# Patient Record
Sex: Male | Born: 1990 | Race: White | Hispanic: No | Marital: Married | Smoking: Former smoker
Health system: Southern US, Community
[De-identification: ages and names within clinical notes are randomized; demographics above are authoritative.]

## PROBLEM LIST (undated history)

## (undated) DIAGNOSIS — K219 Gastro-esophageal reflux disease without esophagitis: Secondary | ICD-10-CM

## (undated) DIAGNOSIS — F32A Depression, unspecified: Secondary | ICD-10-CM

## (undated) DIAGNOSIS — F419 Anxiety disorder, unspecified: Secondary | ICD-10-CM

## (undated) HISTORY — PX: NASAL POLYP SURGERY: SHX186

---
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
## 2018-04-06 ENCOUNTER — Other Ambulatory Visit: Payer: Self-pay

## 2018-04-06 DIAGNOSIS — Y999 Unspecified external cause status: Secondary | ICD-10-CM | POA: Insufficient documentation

## 2018-04-06 DIAGNOSIS — Y93H2 Activity, gardening and landscaping: Secondary | ICD-10-CM | POA: Insufficient documentation

## 2018-04-06 DIAGNOSIS — X500XXA Overexertion from strenuous movement or load, initial encounter: Secondary | ICD-10-CM | POA: Insufficient documentation

## 2018-04-06 DIAGNOSIS — Y929 Unspecified place or not applicable: Secondary | ICD-10-CM | POA: Insufficient documentation

## 2018-04-06 DIAGNOSIS — S76911A Strain of unspecified muscles, fascia and tendons at thigh level, right thigh, initial encounter: Secondary | ICD-10-CM | POA: Diagnosis not present

## 2018-04-06 DIAGNOSIS — S79921A Unspecified injury of right thigh, initial encounter: Secondary | ICD-10-CM | POA: Diagnosis present

## 2018-04-06 DIAGNOSIS — Z87891 Personal history of nicotine dependence: Secondary | ICD-10-CM | POA: Diagnosis not present

## 2018-04-06 NOTE — ED Triage Notes (Addendum)
Pt arrives to ED via POV with c/o RIGHT thigh, leg, and testicle pain x12 hrs. Pt reports s/x's started while trying to pull-start a weed trimmer PTA and questions "pulling a muscle". Pt reports going to a walk-in clinic today to be seen, and states "they just wanted to do x-rays and prescribe streroids, and I can't afford to pay out of pocket". CMS intact in affected extremity. No obvious injury, trauma, or dislocation.

## 2018-04-07 ENCOUNTER — Emergency Department
Admission: EM | Admit: 2018-04-07 | Discharge: 2018-04-07 | Disposition: A | Discharge status: Home / Self Care | Payer: BLUE CROSS/BLUE SHIELD | Attending: Emergency Medicine | Admitting: Emergency Medicine

## 2018-04-07 DIAGNOSIS — T148XXA Other injury of unspecified body region, initial encounter: Secondary | ICD-10-CM

## 2018-04-07 MED ORDER — HYDROCODONE-ACETAMINOPHEN 5-325 MG PO TABS: 1.0000 | ORAL_TABLET | Freq: Four times a day (QID) | ORAL | 0 refills | Status: DC | PRN

## 2018-04-07 MED ORDER — IBUPROFEN 600 MG PO TABS: 600.0000 mg | ORAL_TABLET | Freq: Three times a day (TID) | ORAL | 0 refills | Status: DC | PRN

## 2018-04-07 NOTE — ED Provider Notes (Signed)
[facility] Emergency Department Provider Note  ____________________________________________   First MD Initiated Contact with Patient 04/07/18 0209     (approximate)  I have reviewed the triage vital signs and the nursing notes.   HISTORY  Chief Complaint Leg Pain   HPI Eddie Stout is a 27 y.o. male who self presents to the emergency department with sudden onset severe right thigh and right leg pain that began about 12 hours ago.  It started while he was trying to start a weed trimmer to clean his backyard.  He also has throbbing aching to the right lateral aspect of his testicle.  The pain was roughly constant for about 10 hours although since being in the waiting room his spontaneously resolved.  No nausea or vomiting.  No numbness or weakness.  No bowel or bladder incontinence or hesitance.  No history of IV drug use.  Normal perianal sensation.  No testicular pain at this point.  He went to an urgent care however saw how much she was going to cost to be evaluated and he came here instead.  He currently has no complaints.  His symptoms came on suddenly and passed away quickly on their own.  At the time that they occurred they were worse with walking and improved with rest.  History reviewed. No pertinent past medical history.  There are no active problems to display for this patient.   Past Surgical History:  Procedure Laterality Date  . NASAL POLYP SURGERY      Prior to Admission medications   Medication Sig Start Date End Date Taking? Authorizing Provider  HYDROcodone-acetaminophen (NORCO) 5-325 MG tablet Take 1 tablet by mouth every 6 (six) hours as needed for up to 7 doses for severe pain. 04/07/18   Merrily Brittle, MD  ibuprofen (ADVIL,MOTRIN) 600 MG tablet Take 1 tablet (600 mg total) by mouth every 8 (eight) hours as needed. 04/07/18   Merrily Brittle, MD    Allergies Patient has no known allergies.  No family history on file.  Social  History Social History   Tobacco Use  . Smoking status: Former Games developer  . Smokeless tobacco: Never Used  Substance Use Topics  . Alcohol use: Not on file  . Drug use: Not on file    Review of Systems Constitutional: No fever/chills Eyes: No visual changes. ENT: No sore throat. Cardiovascular: Denies chest pain. Respiratory: Denies shortness of breath. Gastrointestinal: No abdominal pain.  No nausea, no vomiting.  No diarrhea.  No constipation. Genitourinary: Negative for dysuria. Musculoskeletal: Positive for back pain. Skin: Negative for rash. Neurological: Negative for headaches, focal weakness or numbness.   ____________________________________________   PHYSICAL EXAM:  VITAL SIGNS: ED Triage Vitals  Enc Vitals Group     BP 04/06/18 2156 (!) 144/76     Pulse Rate 04/06/18 2156 85     Resp 04/06/18 2156 20     Temp 04/06/18 2156 98.6 F (37 C)     Temp Source 04/06/18 2156 Oral     SpO2 04/06/18 2156 98 %     Weight 04/06/18 2158 (!) 413 lb (187.3 kg)     Height 04/06/18 2158  (1.905 m)     Head Circumference --      Peak Flow --      Pain Score 04/06/18 2205 10     Pain Loc --      Pain Edu? --      Excl. in GC? --  Constitutional: Alert and oriented x4 well-appearing nontoxic no diaphoresis speaks full clear sentences Eyes: PERRL EOMI. Head: Atraumatic. Nose: No congestion/rhinnorhea. Mouth/Throat: No trismus Neck: No stridor.   Cardiovascular: Normal rate, regular rhythm. Grossly normal heart sounds.  Good peripheral circulation. Respiratory: Normal respiratory effort.  No retractions. Lungs CTAB and moving good air Gastrointestinal: Soft nontender Musculoskeletal: No lower extremity edema   No midline back tenderness 5 out of 5 hip flexion extension plantar flexion dorsiflexion 2+ DTRs no ankle clonus Neurologic:  Normal speech and language. No gross focal neurologic deficits are appreciated. Skin:  Skin is warm, dry and intact. No rash  noted. Psychiatric: Mood and affect are normal. Speech and behavior are normal.    ____________________________________________   DIFFERENTIAL includes but not limited to  Radicular low back pain, cauda equina, epidural abscess, testicular torsion ____________________________________________   LABS (all labs ordered are listed, but only abnormal results are displayed)  Labs Reviewed - No data to display   __________________________________________  EKG   ____________________________________________  RADIOLOGY   ____________________________________________   PROCEDURES  Procedure(s) performed: no  Procedures  Critical Care performed: no  Observation: no ____________________________________________   INITIAL IMPRESSION / ASSESSMENT AND PLAN / ED COURSE  Pertinent labs & imaging results that were available during my care of the patient were reviewed by me and considered in my medical decision making (see chart for details).       ----------------------------------------- 2:29 AM on 04/07/2018 -----------------------------------------  While in the waiting room the patient's pain resolved.  He is asymptomatic.  He is neuro intact.  He has no red flags.  He is asking primarily for a work note.  Doubt testicular torsion at this time.  I do lengthy discussion with the patient regarding the diagnostic uncertainty and the importance of strict return precautions.  He verbalizes understanding and agreement with the plan.  We will give a short course of pain medication for home in case his pain should recur.  Discharged home in improved condition he verbalizes understanding agree with the plan. ____________________________________________   FINAL CLINICAL IMPRESSION(S) / ED DIAGNOSES  Final diagnoses:  Muscle strain      NEW MEDICATIONS STARTED DURING THIS VISIT:  Discharge Medication List as of 04/07/2018  2:29 AM    START taking these medications    Details  HYDROcodone-acetaminophen (NORCO) 5-325 MG tablet Take 1 tablet by mouth every 6 (six) hours as needed for up to 7 doses for severe pain., Starting Thu 04/07/2018, Print    ibuprofen (ADVIL,MOTRIN) 600 MG tablet Take 1 tablet (600 mg total) by mouth every 8 (eight) hours as needed., Starting Thu 04/07/2018, Print         Note:  This document was prepared using Dragon voice recognition software and may include unintentional dictation errors.     Merrily Brittle, MD 04/09/18 1057
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
## 2018-04-07 NOTE — Discharge Instructions (Signed)
It was a pleasure to take care of you today, and thank you for coming to our emergency department.  If you have any questions or concerns before leaving please ask the nurse to grab me and I'm more than happy to go through your aftercare instructions again.  If you were prescribed any opioid pain medication today such as Norco, Vicodin, Percocet, morphine, hydrocodone, or oxycodone please make sure you do not drive when you are taking this medication as it can alter your ability to drive safely.  If you have any concerns once you are home that you are not improving or are in fact getting worse before you can make it to your follow-up appointment, please do not hesitate to call 911 and come back for further evaluation.  Merrily Brittle, MD  Unfortunately prescriptions medications can be very expensive.  Please consider [facility] as they have a number of medications that are $4 for a 30 day supply.  [email or web address].[email or web address]  Another great option is www.goodrx.com which can help you find the most affordable prices around you.

## 2020-08-02 ENCOUNTER — Other Ambulatory Visit: Payer: Self-pay | Admitting: Physician Assistant

## 2020-08-02 ENCOUNTER — Ambulatory Visit
Admission: RE | Admit: 2020-08-02 | Discharge: 2020-08-02 | Disposition: A | Discharge status: Home / Self Care | Payer: 59 | Attending: Physician Assistant | Admitting: Physician Assistant

## 2020-08-02 ENCOUNTER — Ambulatory Visit
Admission: RE | Admit: 2020-08-02 | Discharge: 2020-08-02 | Disposition: A | Discharge status: Home / Self Care | Payer: 59 | Source: Ambulatory Visit | Attending: Physician Assistant | Admitting: Physician Assistant

## 2020-08-02 DIAGNOSIS — M545 Low back pain, unspecified: Secondary | ICD-10-CM

## 2020-08-02 DIAGNOSIS — M549 Dorsalgia, unspecified: Secondary | ICD-10-CM | POA: Insufficient documentation

## 2020-08-02 DIAGNOSIS — G8929 Other chronic pain: Secondary | ICD-10-CM | POA: Diagnosis present

## 2020-09-27 ENCOUNTER — Emergency Department: Payer: 59

## 2020-09-27 ENCOUNTER — Encounter: Payer: Self-pay | Admitting: Emergency Medicine

## 2020-09-27 ENCOUNTER — Emergency Department
Admission: EM | Admit: 2020-09-27 | Discharge: 2020-09-27 | Disposition: A | Discharge status: Home / Self Care | Payer: 59 | Attending: Emergency Medicine | Admitting: Emergency Medicine

## 2020-09-27 ENCOUNTER — Other Ambulatory Visit: Payer: Self-pay

## 2020-09-27 DIAGNOSIS — S0990XA Unspecified injury of head, initial encounter: Secondary | ICD-10-CM | POA: Diagnosis present

## 2020-09-27 DIAGNOSIS — Z87891 Personal history of nicotine dependence: Secondary | ICD-10-CM | POA: Diagnosis not present

## 2020-09-27 DIAGNOSIS — Y9389 Activity, other specified: Secondary | ICD-10-CM | POA: Diagnosis not present

## 2020-09-27 DIAGNOSIS — Y92410 Unspecified street and highway as the place of occurrence of the external cause: Secondary | ICD-10-CM | POA: Insufficient documentation

## 2020-09-27 DIAGNOSIS — S301XXA Contusion of abdominal wall, initial encounter: Secondary | ICD-10-CM | POA: Diagnosis not present

## 2020-09-27 DIAGNOSIS — S161XXA Strain of muscle, fascia and tendon at neck level, initial encounter: Secondary | ICD-10-CM | POA: Insufficient documentation

## 2020-09-27 DIAGNOSIS — R109 Unspecified abdominal pain: Secondary | ICD-10-CM

## 2020-09-27 DIAGNOSIS — R519 Headache, unspecified: Secondary | ICD-10-CM

## 2020-09-27 HISTORY — DX: Depression, unspecified: F32.A

## 2020-09-27 HISTORY — DX: Anxiety disorder, unspecified: F41.9

## 2020-09-27 HISTORY — DX: Gastro-esophageal reflux disease without esophagitis: K21.9

## 2020-09-27 LAB — COMPREHENSIVE METABOLIC PANEL
ALT: 28 U/L (ref 0–44)
AST: 23 U/L (ref 15–41)
Albumin: 4.2 g/dL (ref 3.5–5.0)
Alkaline Phosphatase: 81 U/L (ref 38–126)
Anion gap: 9 (ref 5–15)
BUN: 16 mg/dL (ref 6–20)
CO2: 27 mmol/L (ref 22–32)
Calcium: 8.8 mg/dL — ABNORMAL LOW (ref 8.9–10.3)
Chloride: 106 mmol/L (ref 98–111)
Creatinine, Ser: 1.18 mg/dL (ref 0.61–1.24)
GFR, Estimated: >60 mL/min (ref >60)
Glucose, Bld: 101 mg/dL — ABNORMAL HIGH (ref 70–99)
Potassium: 3.9 mmol/L (ref 3.5–5.1)
Sodium: 142 mmol/L (ref 135–145)
Total Bilirubin: 0.8 mg/dL (ref 0.3–1.2)
Total Protein: 7.7 g/dL (ref 6.5–8.1)

## 2020-09-27 LAB — CBC WITH DIFFERENTIAL/PLATELET
Abs Immature Granulocytes: 0.04 10*3/uL (ref 0.00–0.07)
Basophils Absolute: 0 10*3/uL (ref 0.0–0.1)
Basophils Relative: 0 %
Eosinophils Absolute: 0.1 10*3/uL (ref 0.0–0.5)
Eosinophils Relative: 1 %
HCT: 43.5 % (ref 39.0–52.0)
Hemoglobin: 14.4 g/dL (ref 13.0–17.0)
Immature Granulocytes: 0 %
Lymphocytes Relative: 19 %
Lymphs Abs: 2.8 10*3/uL (ref 0.7–4.0)
MCH: 27.9 pg (ref 26.0–34.0)
MCHC: 33.1 g/dL (ref 30.0–36.0)
MCV: 84.1 fL (ref 80.0–100.0)
Monocytes Absolute: 0.8 10*3/uL (ref 0.1–1.0)
Monocytes Relative: 5 %
Neutro Abs: 11 10*3/uL — ABNORMAL HIGH (ref 1.7–7.7)
Neutrophils Relative %: 75 %
Platelets: 286 10*3/uL (ref 150–400)
RBC: 5.17 MIL/uL (ref 4.22–5.81)
RDW: 13.7 % (ref 11.5–15.5)
WBC: 14.7 10*3/uL — ABNORMAL HIGH (ref 4.0–10.5)
nRBC: 0 % (ref 0.0–0.2)

## 2020-09-27 LAB — URINALYSIS, COMPLETE (UACMP) WITH MICROSCOPIC
Bacteria, UA: NONE SEEN
Bilirubin Urine: NEGATIVE
Glucose, UA: NEGATIVE mg/dL
Hgb urine dipstick: NEGATIVE
Ketones, ur: NEGATIVE mg/dL
Leukocytes,Ua: NEGATIVE
Nitrite: NEGATIVE
Protein, ur: NEGATIVE mg/dL
Specific Gravity, Urine: 1.028 (ref 1.005–1.030)
Squamous Epithelial / HPF: NONE SEEN (ref 0–5)
pH: 6 (ref 5.0–8.0)

## 2020-09-27 MED ORDER — IOHEXOL 300 MG/ML  SOLN
125.0000 mL | Freq: Once | INTRAMUSCULAR | Status: AC | PRN
  Administered 2020-09-27: 125 mL via INTRAVENOUS
  Filled 2020-09-27: qty 125

## 2020-09-27 MED ORDER — HYDROCODONE-ACETAMINOPHEN 5-325 MG PO TABS: 1.0000 | ORAL_TABLET | Freq: Four times a day (QID) | ORAL | 0 refills | Status: AC | PRN

## 2020-09-27 MED ORDER — ONDANSETRON HCL 4 MG/2ML IJ SOLN
4.0000 mg | Freq: Once | INTRAMUSCULAR | Status: AC
  Administered 2020-09-27: 4 mg via INTRAVENOUS
  Filled 2020-09-27: qty 2

## 2020-09-27 MED ORDER — MORPHINE SULFATE (PF) 4 MG/ML IV SOLN
4.0000 mg | Freq: Once | INTRAVENOUS | Status: AC
  Administered 2020-09-27: 4 mg via INTRAVENOUS
  Filled 2020-09-27: qty 1

## 2020-09-27 MED ORDER — CYCLOBENZAPRINE HCL 10 MG PO TABS: 10.0000 mg | ORAL_TABLET | Freq: Three times a day (TID) | ORAL | 0 refills | Status: AC | PRN

## 2020-09-27 NOTE — Discharge Instructions (Signed)
Follow-up with your regular doctor if not improving in 3 to 4 days.  Return to the emergency department if worsening. 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  You should have repeat imaging in approximately 6 months. Take your medication as prescribed
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
## 2020-10-09 ENCOUNTER — Encounter: Payer: Self-pay | Admitting: Emergency Medicine

## 2020-10-09 ENCOUNTER — Emergency Department
Admission: EM | Admit: 2020-10-09 | Discharge: 2020-10-09 | Disposition: A | Discharge status: Home / Self Care | Payer: 59 | Attending: Emergency Medicine | Admitting: Emergency Medicine

## 2020-10-09 ENCOUNTER — Other Ambulatory Visit: Payer: Self-pay

## 2020-10-09 DIAGNOSIS — Y92410 Unspecified street and highway as the place of occurrence of the external cause: Secondary | ICD-10-CM | POA: Insufficient documentation

## 2020-10-09 DIAGNOSIS — S39012A Strain of muscle, fascia and tendon of lower back, initial encounter: Secondary | ICD-10-CM | POA: Diagnosis not present

## 2020-10-09 DIAGNOSIS — Z87891 Personal history of nicotine dependence: Secondary | ICD-10-CM | POA: Insufficient documentation

## 2020-10-09 DIAGNOSIS — M5431 Sciatica, right side: Secondary | ICD-10-CM | POA: Insufficient documentation

## 2020-10-09 DIAGNOSIS — Y9389 Activity, other specified: Secondary | ICD-10-CM | POA: Diagnosis not present

## 2020-10-09 DIAGNOSIS — S3992XA Unspecified injury of lower back, initial encounter: Secondary | ICD-10-CM | POA: Diagnosis present

## 2020-10-09 MED ORDER — LIDOCAINE 5 % EX PTCH
1.0000 | MEDICATED_PATCH | CUTANEOUS | Status: DC
  Administered 2020-10-09: 1 via TRANSDERMAL
  Filled 2020-10-09: qty 1

## 2020-10-09 MED ORDER — PREDNISONE 10 MG (21) PO TBPK: ORAL_TABLET | ORAL | 0 refills | Status: AC

## 2020-10-09 MED ORDER — LIDOCAINE 5 % EX PTCH: 1.0000 | MEDICATED_PATCH | Freq: Two times a day (BID) | CUTANEOUS | 0 refills | Status: AC

## 2020-10-09 MED ORDER — PREDNISONE 20 MG PO TABS
60.0000 mg | ORAL_TABLET | Freq: Once | ORAL | Status: AC
  Administered 2020-10-09: 60 mg via ORAL
  Filled 2020-10-09: qty 3

## 2020-10-09 MED ORDER — KETOROLAC TROMETHAMINE 60 MG/2ML IM SOLN
60.0000 mg | Freq: Once | INTRAMUSCULAR | Status: AC
  Administered 2020-10-09: 60 mg via INTRAMUSCULAR
  Filled 2020-10-09: qty 2

## 2020-10-09 MED ORDER — KETOROLAC TROMETHAMINE 10 MG PO TABS: 10.0000 mg | ORAL_TABLET | Freq: Three times a day (TID) | ORAL | 0 refills | Status: AC | PRN

## 2020-10-09 NOTE — Discharge Instructions (Signed)
Please seek medical attention for any high fevers, chest pain, shortness of breath, change in behavior, persistent vomiting, bloody stool or any other new or concerning symptoms.  
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
## 2022-12-08 DIAGNOSIS — Z1159 Encounter for screening for other viral diseases: Secondary | ICD-10-CM | POA: Diagnosis not present

## 2022-12-08 DIAGNOSIS — Z1389 Encounter for screening for other disorder: Secondary | ICD-10-CM | POA: Diagnosis not present

## 2022-12-22 DIAGNOSIS — R69 Illness, unspecified: Secondary | ICD-10-CM | POA: Diagnosis not present

## 2022-12-22 DIAGNOSIS — I10 Essential (primary) hypertension: Secondary | ICD-10-CM | POA: Diagnosis not present

## 2022-12-22 DIAGNOSIS — K219 Gastro-esophageal reflux disease without esophagitis: Secondary | ICD-10-CM | POA: Diagnosis not present

## 2022-12-22 DIAGNOSIS — Z1331 Encounter for screening for depression: Secondary | ICD-10-CM | POA: Diagnosis not present

## 2023-08-03 DIAGNOSIS — Z79899 Other long term (current) drug therapy: Secondary | ICD-10-CM | POA: Diagnosis not present

## 2023-08-03 DIAGNOSIS — I10 Essential (primary) hypertension: Secondary | ICD-10-CM | POA: Diagnosis not present

## 2023-08-03 DIAGNOSIS — F418 Other specified anxiety disorders: Secondary | ICD-10-CM | POA: Diagnosis not present

## 2023-08-03 DIAGNOSIS — K219 Gastro-esophageal reflux disease without esophagitis: Secondary | ICD-10-CM | POA: Diagnosis not present

## 2023-11-03 DIAGNOSIS — G4700 Insomnia, unspecified: Secondary | ICD-10-CM | POA: Diagnosis not present

## 2023-11-03 DIAGNOSIS — K219 Gastro-esophageal reflux disease without esophagitis: Secondary | ICD-10-CM | POA: Diagnosis not present

## 2023-11-03 DIAGNOSIS — R739 Hyperglycemia, unspecified: Secondary | ICD-10-CM | POA: Diagnosis not present

## 2023-11-03 DIAGNOSIS — F418 Other specified anxiety disorders: Secondary | ICD-10-CM | POA: Diagnosis not present

## 2023-11-03 DIAGNOSIS — I10 Essential (primary) hypertension: Secondary | ICD-10-CM | POA: Diagnosis not present

## 2023-11-03 DIAGNOSIS — F909 Attention-deficit hyperactivity disorder, unspecified type: Secondary | ICD-10-CM | POA: Diagnosis not present
